# Patient Record
Sex: Male | Born: 1980 | Race: White | Hispanic: No | Marital: Single
Health system: Southern US, Community
[De-identification: ages and names within clinical notes are randomized; demographics above are authoritative.]

---

## 2008-03-11 ENCOUNTER — Emergency Department (HOSPITAL_COMMUNITY): Admission: EM | Admit: 2008-03-11 | Discharge: 2008-03-11 | Payer: Self-pay | Admitting: Emergency Medicine

## 2010-06-15 ENCOUNTER — Emergency Department (HOSPITAL_COMMUNITY): Admission: EM | Admit: 2010-06-15 | Discharge: 2010-06-16 | Payer: Self-pay | Admitting: Emergency Medicine

## 2011-01-27 LAB — DIFFERENTIAL
Lymphs Abs: 1.9 10*3/uL (ref 0.7–4.0)
Monocytes Absolute: 1.5 10*3/uL — ABNORMAL HIGH (ref 0.1–1.0)

## 2011-01-27 LAB — BASIC METABOLIC PANEL
BUN: 4 mg/dL — ABNORMAL LOW (ref 6–23)
Creatinine, Ser: 0.83 mg/dL (ref 0.4–1.5)
GFR calc Af Amer: >60 mL/min (ref >60)
GFR calc non Af Amer: >60 mL/min (ref >60)
Sodium: 137 mEq/L (ref 135–145)

## 2011-01-27 LAB — CBC
HCT: 45.2 % (ref 39.0–52.0)
MCH: 31.3 pg (ref 26.0–34.0)
MCHC: 34.1 g/dL (ref 30.0–36.0)
Platelets: 220 10*3/uL (ref 150–400)
RBC: 4.92 MIL/uL (ref 4.22–5.81)

## 2011-01-27 LAB — D-DIMER, QUANTITATIVE: D-Dimer, Quant: 0.54 ug/mL-FEU — ABNORMAL HIGH (ref 0.00–0.48)

## 2011-06-23 IMAGING — CT CT ANGIO CHEST
1 of 6 series · 5 of 36 positions shown · IV contrast (Omnipaque 300)
Comparison: Chest x-ray 06/15/2010

CLINICAL DATA: Chest pain and shortness of breath.  Elevated white
blood cell count and D-dimer.

CT ANGIOGRAPHY CHEST WITH CONTRAST
TECHNIQUE: Multidetector CT imaging of the chest was performed
using the standard protocol during bolus administration of
intravenous contrast.  Multiplanar CT image reconstructions
including MIPs were obtained to evaluate the vascular anatomy.
Contrast:  100 ml Vmnipaque-3DD IV

[Series 5: pe 3.0 b40f · axial · 0.69mm/px · z∈[-235,-37]mm · 5 of 100 slices shown]
[im 17/100  lung]
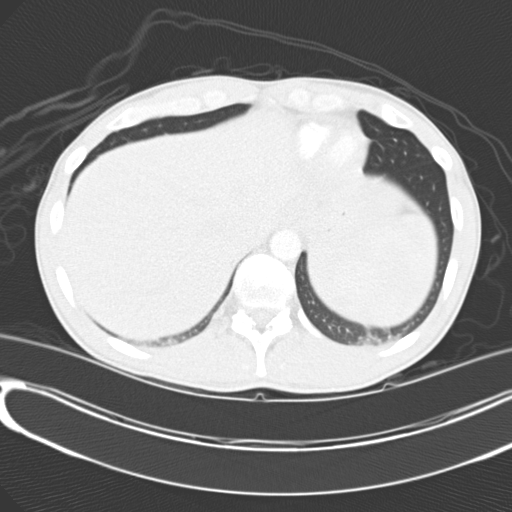
[im 34/100  mediastinal]
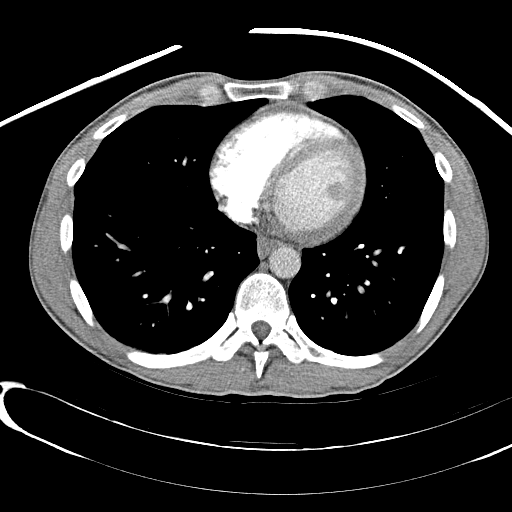
[im 50/100  lung]
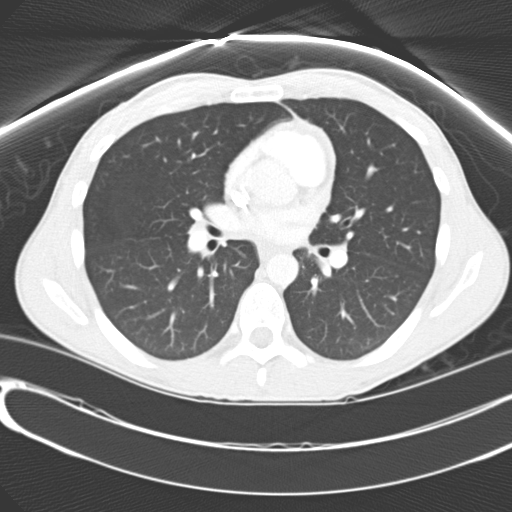
[im 67/100  mediastinal]
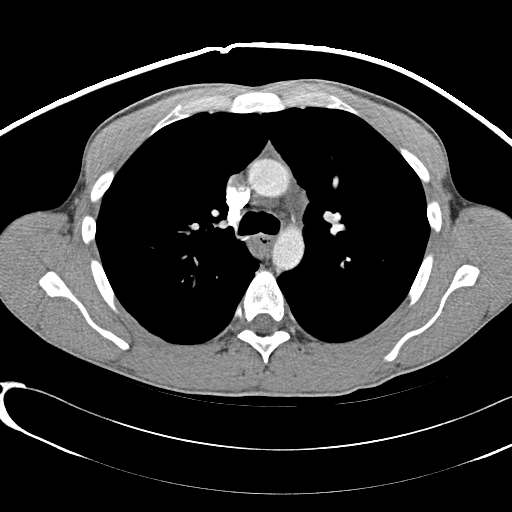
[im 83/100  lung]
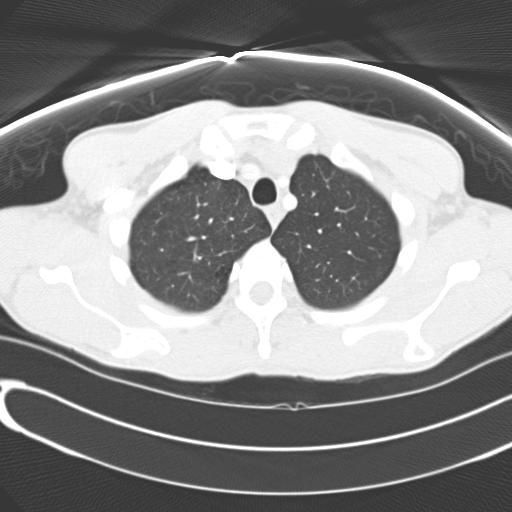

[5 of 36 positions shown; findings below may reference images not displayed]

FINDINGS: No evidence of pulmonary embolism.  No infiltrates or
edema.  No pleural or pericardial fluid.  The heart size is normal.
No pulmonary nodules or enlarged lymph nodes.  No pneumothorax.
Airways are normally patent.  No bony abnormalities.

Review of the MIP images confirms the above findings.
IMPRESSION: Normal CTA of chest.  No evidence of pulmonary embolism or other
acute process.

## 2019-09-08 ENCOUNTER — Ambulatory Visit: Payer: Self-pay

## 2019-09-08 NOTE — Telephone Encounter (Signed)
Pt. Calling for testing sites for COVID 19. States the [place] site is closed today. States he does not want to drive to [place] or [place]. Instructed pt. To call [facility] Dept. For local sites.

## 2019-12-19 ENCOUNTER — Other Ambulatory Visit: Payer: Self-pay

## 2019-12-19 ENCOUNTER — Ambulatory Visit: Payer: Self-pay | Attending: Internal Medicine

## 2019-12-19 DIAGNOSIS — Z20822 Contact with and (suspected) exposure to covid-19: Secondary | ICD-10-CM | POA: Insufficient documentation

## 2019-12-21 LAB — NOVEL CORONAVIRUS, NAA: SARS-CoV-2, NAA: NOT DETECTED

## 2023-10-21 ENCOUNTER — Emergency Department (HOSPITAL_BASED_OUTPATIENT_CLINIC_OR_DEPARTMENT_OTHER): Payer: Self-pay | Admitting: Radiology

## 2023-10-21 ENCOUNTER — Other Ambulatory Visit: Payer: Self-pay

## 2023-10-21 ENCOUNTER — Emergency Department (HOSPITAL_BASED_OUTPATIENT_CLINIC_OR_DEPARTMENT_OTHER)
Admission: EM | Admit: 2023-10-21 | Discharge: 2023-10-21 | Disposition: A | Discharge status: Home / Self Care | Payer: Self-pay | Attending: Emergency Medicine | Admitting: Emergency Medicine

## 2023-10-21 DIAGNOSIS — B349 Viral infection, unspecified: Secondary | ICD-10-CM | POA: Insufficient documentation

## 2023-10-21 DIAGNOSIS — R059 Cough, unspecified: Secondary | ICD-10-CM | POA: Diagnosis present

## 2023-10-21 DIAGNOSIS — Z20822 Contact with and (suspected) exposure to covid-19: Secondary | ICD-10-CM | POA: Insufficient documentation

## 2023-10-21 LAB — RESP PANEL BY RT-PCR (RSV, FLU A&B, COVID)  RVPGX2
Influenza A by PCR: NEGATIVE
Influenza B by PCR: NEGATIVE
Resp Syncytial Virus by PCR: NEGATIVE
SARS Coronavirus 2 by RT PCR: NEGATIVE

## 2023-10-21 LAB — GROUP A STREP BY PCR: Group A Strep by PCR: NOT DETECTED

## 2023-10-21 MED ORDER — HYDROCOD POLI-CHLORPHE POLI ER 10-8 MG/5ML PO SUER
5.0000 mL | Freq: Once | ORAL | Status: AC
  Administered 2023-10-21: 5 mL via ORAL
  Filled 2023-10-21: qty 5

## 2023-10-21 MED ORDER — HYDROCOD POLI-CHLORPHE POLI ER 10-8 MG/5ML PO SUER: 5.0000 mL | Freq: Two times a day (BID) | ORAL | 0 refills | Status: AC | PRN

## 2023-10-21 MED ORDER — KETOROLAC TROMETHAMINE 30 MG/ML IJ SOLN
30.0000 mg | Freq: Once | INTRAMUSCULAR | Status: AC
  Administered 2023-10-21: 30 mg via INTRAMUSCULAR
  Filled 2023-10-21: qty 1

## 2023-10-21 NOTE — ED Notes (Signed)
Dc instructions reviewed with patient. Patient voiced understanding. Dc with belongings.  °

## 2023-10-21 NOTE — Discharge Instructions (Addendum)
You were seen in the emergency department today for flu like symptoms.  As we discussed you tested negative for flu, COVID, and RSV.  However, I think you likely have a different virus causing your symptoms today. We unfortunately cannot test for them all, but we treat them all the same way. Your chest x-ray was negative for pneumonia.  You can take ibuprofen or Tylenol for pain or fever, and I recommend alternating between the 2.  Make sure that you are drinking lots of fluids and getting plenty of rest. You can take decongestants as long as you take them with lots of water. You can use lozenges or chloraseptic spray as needed for sore throat.   Please use Tylenol or ibuprofen for pain.  You may use 600 mg ibuprofen every 6 hours or 1000 mg of Tylenol every 6 hours.  You may choose to alternate between the 2.  This would be most effective.  Do not exceed 4 g of Tylenol within 24 hours.  Do not exceed 3200 mg ibuprofen within 24 hours.  Continue to monitor how you are doing, and return to the emergency department for new or worsening symptoms such as chest pain, difficulty breathing not related to coughing, fever despite medication, or persistent vomiting or diarrhea.  It has been a pleasure taking care of you today and I hope you begin to feel better soon!

## 2023-10-21 NOTE — ED Triage Notes (Signed)
Pt POV reporting URI sx, sore throat, diarrhea, and fatigue past few days. Endorses chills.

## 2023-10-21 NOTE — ED Provider Notes (Signed)
[place] EMERGENCY DEPARTMENT AT [facility] Provider Note   CSN: [id number] Arrival date & time: 10/21/23  1505     History  Chief Complaint  Patient presents with   URI    Gary Turner is a 42 y.o. male with no significant medical history presents the emergency department complaining of productive cough, sore throat, diarrhea, and fatigue for the past few days.  Significant other at bedside has been giving different over-the-counter medications without relief.    URI Presenting symptoms: congestion, cough, fatigue and sore throat   Associated symptoms: myalgias        Home Medications Prior to Admission medications   Medication Sig Start Date End Date Taking? Authorizing Provider  chlorpheniramine-HYDROcodone (TUSSIONEX) 10-8 MG/5ML Take 5 mLs by mouth every 12 (twelve) hours as needed for cough. 10/21/23  Yes Franchot Pollitt T, PA-C      Allergies    Amoxicillin    Review of Systems   Review of Systems  Constitutional:  Positive for chills and fatigue.  HENT:  Positive for congestion and sore throat.   Respiratory:  Positive for cough.   Gastrointestinal:  Positive for diarrhea.  Musculoskeletal:  Positive for myalgias.  All other systems reviewed and are negative.   Physical Exam Updated Vital Signs BP (!) 142/106   Pulse 71   Temp 98.2 F (36.8 C)   Resp 18   Ht 5\' 11"  (1.803 m)   Wt 81.6 kg   SpO2 100%   BMI 25.10 kg/m  Physical Exam Vitals and nursing note reviewed.  Constitutional:      Appearance: Normal appearance.  HENT:     Head: Normocephalic and atraumatic.  Eyes:     Conjunctiva/sclera: Conjunctivae normal.  Cardiovascular:     Rate and Rhythm: Normal rate and regular rhythm.  Pulmonary:     Effort: Pulmonary effort is normal. No respiratory distress.     Breath sounds: Normal breath sounds.  Abdominal:     General: There is no distension.     Palpations: Abdomen is soft.     Tenderness: There is no abdominal  tenderness.  Skin:    General: Skin is warm and dry.  Neurological:     General: No focal deficit present.     Mental Status: He is alert.     ED Results / Procedures / Treatments   Labs (all labs ordered are listed, but only abnormal results are displayed) Labs Reviewed  RESP PANEL BY RT-PCR (RSV, FLU A&B, COVID)  [id number]  GROUP A STREP BY PCR    EKG None  Radiology DG Chest 2 View  Result Date: 10/21/2023 CLINICAL DATA:  Cough, fever, body aches EXAM: CHEST - 2 VIEW COMPARISON:  CT chest dated 06/16/2010 FINDINGS: Lungs are clear.  No pleural effusion or pneumothorax. The heart is normal in size. Visualized osseous structures are within normal limits. IMPRESSION: Normal chest radiographs. Electronically Signed   By: Charline Bills M.D.   On: 10/21/2023 17:51    Procedures Procedures    Medications Ordered in ED Medications  ketorolac (TORADOL) 30 MG/ML injection 30 mg (30 mg Intramuscular Given 10/21/23 1733)  chlorpheniramine-HYDROcodone (TUSSIONEX) 10-8 MG/5ML suspension 5 mL (5 mLs Oral Given 10/21/23 1734)    ED Course/ Medical Decision Making/ A&P                                 Medical Decision Making Amount and/or Complexity of  Data Reviewed Radiology: ordered.  Risk Prescription drug management.   This patient is a 42 y.o. male who presents to the ED for concern of nasal congestion, cough, diarrhea, fatigue.   Differential diagnoses prior to evaluation: The emergent differential diagnosis includes, but is not limited to,  upper respiratory infection, lower respiratory infection, allergies, asthma, irritants, sinus/esophageal foreign body, medications, reflux, interstitial lung disease, postnasal drip, viral illness, sepsis. This is not an exhaustive differential.   Past Medical History / Co-morbidities / Additional history: Chart reviewed. Pertinent results include: No past medical history on file.  Physical Exam: Physical exam performed. The  pertinent findings include: Appears fatigued but not ill appearing. Mildly hypertensive, otherwise normal vitals. Lung sounds clear. Abdomen soft and non-tender.   Lab Tests/Imaging studies: I personally interpreted labs/imaging and the pertinent results include:  respiratory panel negative. Strep negative. CXR without acute findings. I agree with the radiologist interpretation.    Medications: I ordered medication including tussionex and toradol.  I have reviewed the patients home medicines and have made adjustments as needed.   Disposition: After consideration of the diagnostic results and the patients response to treatment, I feel that emergency department workup does not suggest an emergent condition requiring admission or immediate intervention beyond what has been performed at this time. Patient with symptoms consistent with viral URI.  Vitals are stable. No signs of dehydration, tolerating PO's.  Lungs are clear.   The plan is: Patient will be discharged with instructions to orally hydrate, rest, and use over-the-counter medications such as anti-inflammatories such as ibuprofen and Tylenol for fever. The patient is safe for discharge and has been instructed to return immediately for worsening symptoms, change in symptoms or any other concerns.  Final Clinical Impression(s) / ED Diagnoses Final diagnoses:  Viral illness    Rx / DC Orders ED Discharge Orders          Ordered    chlorpheniramine-HYDROcodone (TUSSIONEX) 10-8 MG/5ML  Every 12 hours PRN        10/21/23 1800           Portions of this report may have been transcribed using voice recognition software. Every effort was made to ensure accuracy; however, inadvertent computerized transcription errors may be present.    Su Monks, PA-C 10/21/23 1801    Sloan Leiter, DO 10/27/23 [phone]
